# Patient Record
Sex: Male | Born: 2000 | Race: White | Hispanic: No | Marital: Single | Smoking: Never smoker
Health system: Southern US, Community
[De-identification: ages and names within clinical notes are randomized; demographics above are authoritative.]

## PROBLEM LIST (undated history)

## (undated) DIAGNOSIS — S83249A Other tear of medial meniscus, current injury, unspecified knee, initial encounter: Secondary | ICD-10-CM

---
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
## 2018-07-05 IMAGING — CR DG ANKLE COMPLETE 3+V*L*
3 series · 3 of 3 positions shown · non-contrast
Comparison: None.

CLINICAL DATA: Left ankle injury playing basketball today.

EXAM:
LEFT ANKLE COMPLETE - 3+ VIEW

[ankle ap]
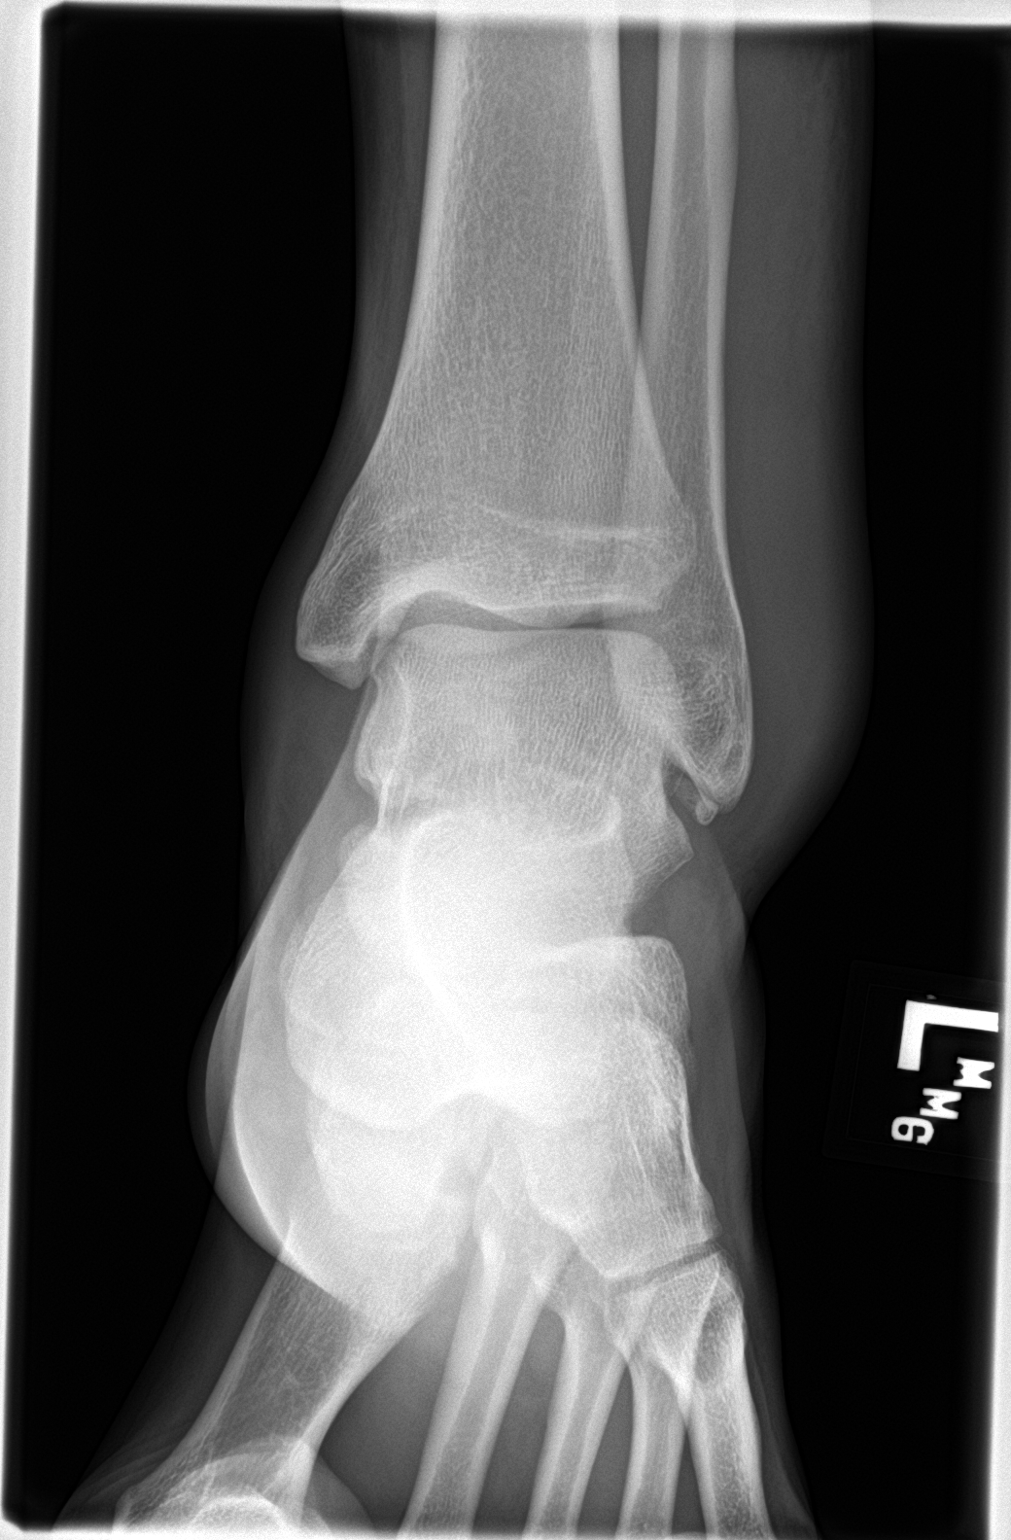

[ankle obl]
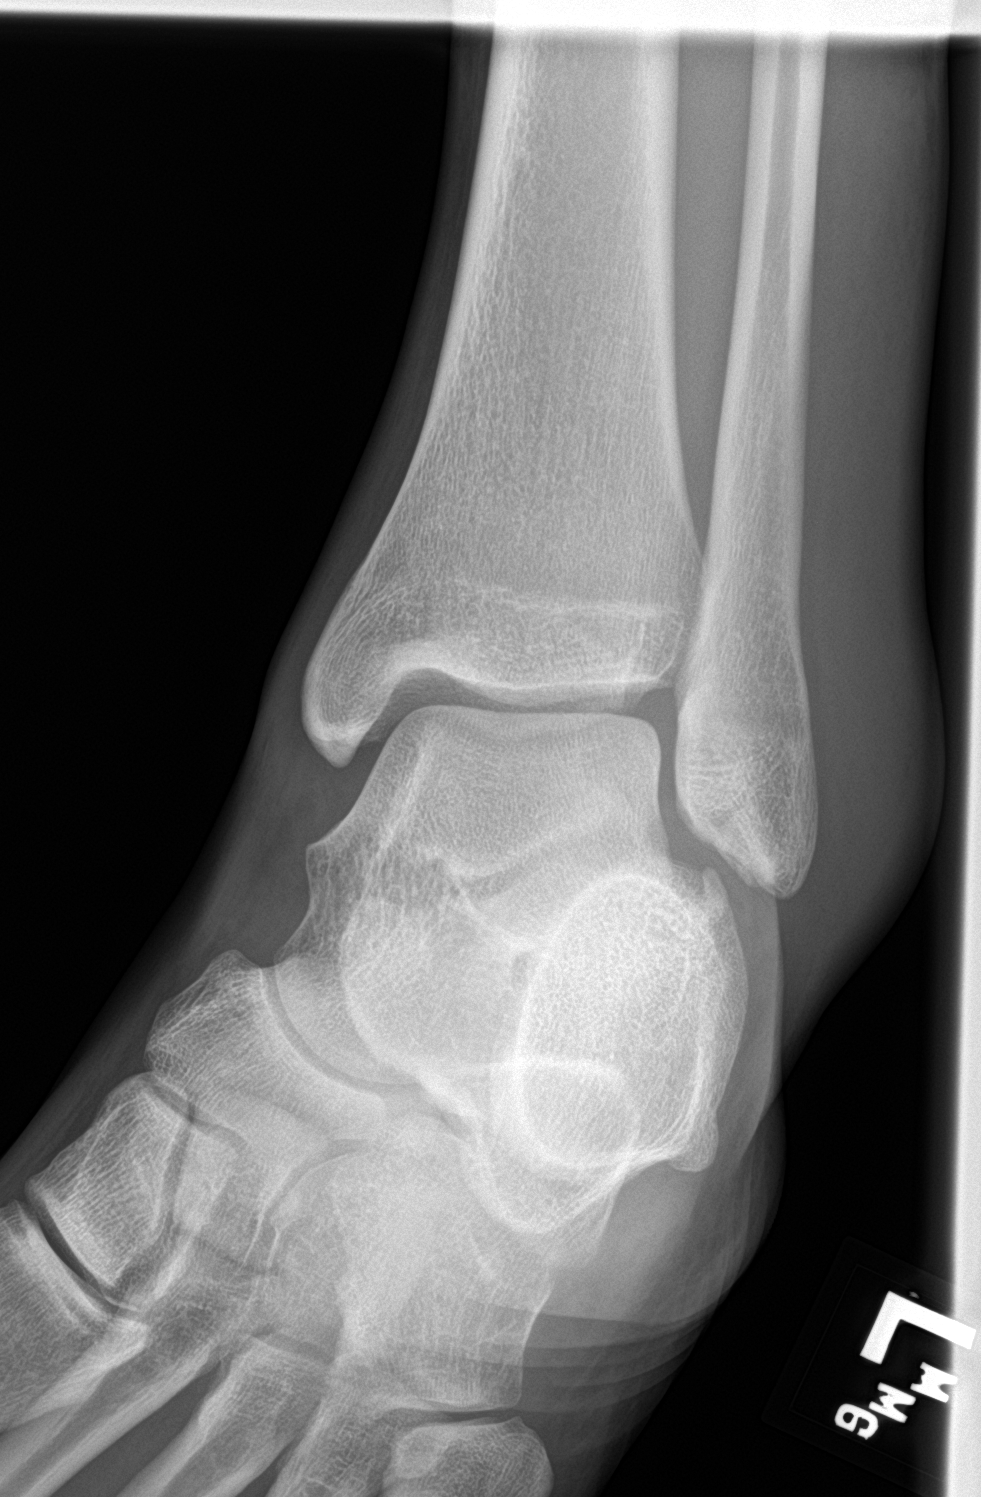

[ankle lat]
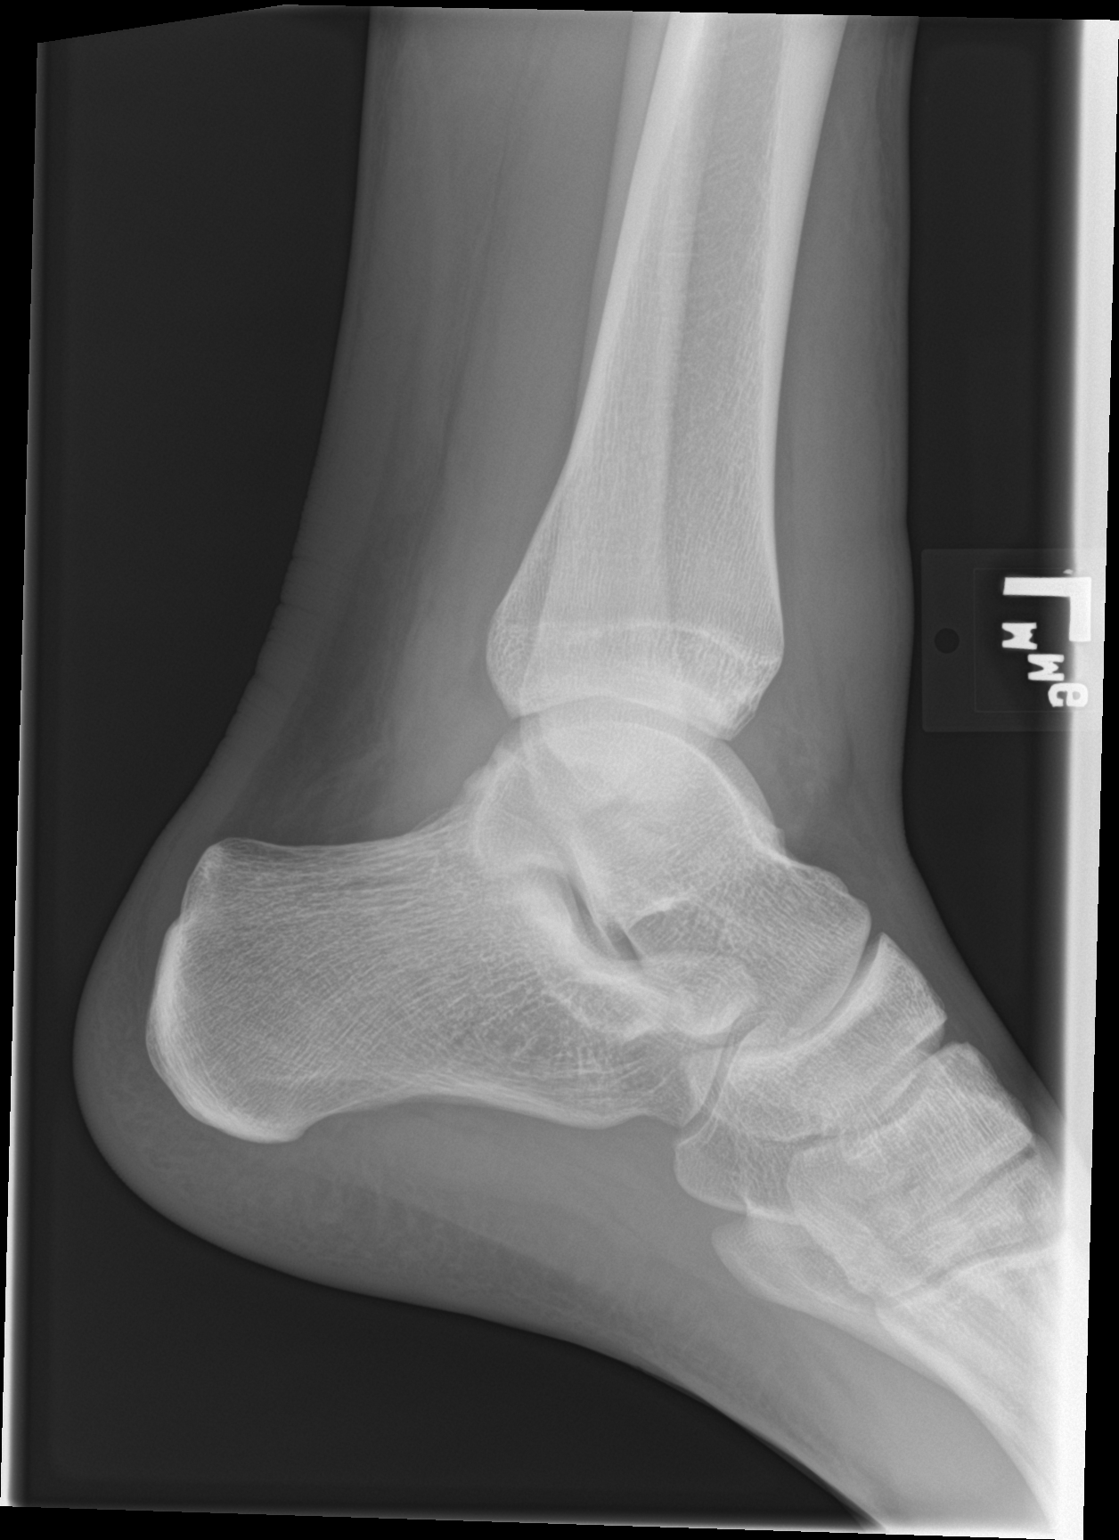

[3 of 3 positions shown; findings below may reference images not displayed]

FINDINGS: There is no evidence of fracture, dislocation, or joint effusion.
There is no evidence of arthropathy or other focal bone abnormality.
Severe soft tissue swelling over the lateral malleolus.
IMPRESSION: No acute osseous injury of the left ankle. Severe soft tissue
swelling over the lateral malleolus.
